# Patient Record
Sex: Male | Born: 1965 | Race: Black or African American | Hispanic: No | Marital: Single | Smoking: Never smoker
Health system: Southern US, Community
[De-identification: ages and names within clinical notes are randomized; demographics above are authoritative.]

## PROBLEM LIST (undated history)

## (undated) DIAGNOSIS — T7840XA Allergy, unspecified, initial encounter: Secondary | ICD-10-CM

## (undated) HISTORY — DX: Allergy, unspecified, initial encounter: T78.40XA

---

## 2010-05-10 ENCOUNTER — Emergency Department (HOSPITAL_COMMUNITY)
Admission: EM | Admit: 2010-05-10 | Discharge: 2010-05-10 | Payer: Self-pay | Source: Home / Self Care | Admitting: Emergency Medicine

## 2012-03-16 ENCOUNTER — Encounter (HOSPITAL_COMMUNITY): Payer: Self-pay | Admitting: Emergency Medicine

## 2012-03-16 ENCOUNTER — Emergency Department (HOSPITAL_COMMUNITY)
Admission: EM | Admit: 2012-03-16 | Discharge: 2012-03-16 | Disposition: A | Discharge status: Home / Self Care | Payer: Self-pay | Attending: Emergency Medicine | Admitting: Emergency Medicine

## 2012-03-16 DIAGNOSIS — M5430 Sciatica, unspecified side: Secondary | ICD-10-CM | POA: Insufficient documentation

## 2012-03-16 DIAGNOSIS — R52 Pain, unspecified: Secondary | ICD-10-CM | POA: Insufficient documentation

## 2012-03-16 DIAGNOSIS — G8929 Other chronic pain: Secondary | ICD-10-CM | POA: Insufficient documentation

## 2012-03-16 DIAGNOSIS — M549 Dorsalgia, unspecified: Secondary | ICD-10-CM

## 2012-03-16 MED ORDER — KETOROLAC TROMETHAMINE 60 MG/2ML IM SOLN
60.0000 mg | Freq: Once | INTRAMUSCULAR | Status: AC
  Administered 2012-03-16: 60 mg via INTRAMUSCULAR
  Filled 2012-03-16: qty 2

## 2012-03-16 MED ORDER — DIAZEPAM 5 MG PO TABS: 5.0000 mg | ORAL_TABLET | Freq: Two times a day (BID) | ORAL | Status: DC

## 2012-03-16 MED ORDER — OXYCODONE-ACETAMINOPHEN 5-325 MG PO TABS: 1.0000 | ORAL_TABLET | ORAL | Status: DC | PRN

## 2012-03-16 NOTE — ED Notes (Signed)
PT. REPORTS LOW BACK PAIN RADIAITING TO LEFT LEG , STATES MOVING HEAVY FURNITURE LAST Sunday , ALSO REPORTS HISTORY OF SCIATICA.

## 2012-03-16 NOTE — ED Provider Notes (Signed)
History     CSN: [id number]  Arrival date & time 03/16/12  0700   First MD Initiated Contact with Patient 03/16/12 [phone]      Chief Complaint  Patient presents with  . Back Pain    (Consider location/radiation/quality/duration/timing/severity/associated sxs/prior treatment) Patient is a 46 y.o. male presenting with back pain. The history is provided by the patient. No language interpreter was used.  Back Pain  This is a recurrent problem. The current episode started more than 2 days ago. The problem occurs daily. The problem has been gradually worsening. The pain is associated with lifting heavy objects. The quality of the pain is described as shooting and burning. The pain radiates to the left thigh. The pain is at a severity of 8/10. The pain is moderate. The symptoms are aggravated by certain positions. The pain is the same all the time. Pertinent negatives include no fever, no numbness, no bowel incontinence, no perianal numbness, no bladder incontinence, no dysuria, no leg pain, no paresthesias, no paresis, no tingling and no weakness. He has tried NSAIDs for the symptoms. The treatment provided mild relief.   46 year old male with recurrent lower back pain that radiates into the left thigh. Denies bladder or bowel incontinence or fever. Patient is taken ibuprofen with mild relief. Patient states that the pain began last Friday and got worse Sunday when he moved furniture. No point tenderness over the lumbar spine. Past medical history of chronic lower back pain since 2008.  History reviewed. No pertinent past medical history.  History reviewed. No pertinent past surgical history.  No family history on file.  History  Substance Use Topics  . Smoking status: Never Smoker   . Smokeless tobacco: Not on file  . Alcohol Use: Yes      Review of Systems  Constitutional: Negative.  Negative for fever.  HENT: Negative.   Eyes: Negative.   Respiratory: Negative.   Cardiovascular:  Negative.  Negative for leg swelling.  Gastrointestinal: Negative.  Negative for nausea, vomiting and bowel incontinence.  Genitourinary: Negative for bladder incontinence and dysuria.  Musculoskeletal: Positive for back pain and gait problem.  Neurological: Negative.  Negative for tingling, weakness, numbness and paresthesias.  Psychiatric/Behavioral: Negative.   All other systems reviewed and are negative.    Allergies  Review of patient's allergies indicates no known allergies.  Home Medications   Current Outpatient Rx  Name Route Sig Dispense Refill  . ADULT MULTIVITAMIN W/MINERALS CH Oral Take 1 tablet by mouth daily.      BP 155/97  Pulse 80  Temp 97.9 F (36.6 C) (Oral)  Resp 20  SpO2 97%  Physical Exam  Nursing note and vitals reviewed. Constitutional: He is oriented to person, place, and time. He appears well-developed and well-nourished.  HENT:  Head: Normocephalic.  Eyes: Conjunctivae normal and EOM are normal. Pupils are equal, round, and reactive to light.  Neck: Normal range of motion. Neck supple.  Cardiovascular: Normal rate.   Pulmonary/Chest: Effort normal.  Abdominal: Soft.  Musculoskeletal: Normal range of motion. He exhibits tenderness.       Left lower back tenderness into the left buttocks and posterior thigh  Neurological: He is alert and oriented to person, place, and time. He has normal reflexes. No cranial nerve deficit. Coordination normal.  Skin: Skin is warm and dry.  Psychiatric: He has a normal mood and affect.    ED Course  Procedures (including critical care time)  Labs Reviewed - No data to display No results  found.   No diagnosis found.    MDM  46 year old male with acute on chronic lumbar back pain with sciatica. Toradol 60 IM in the ER. Prescription for Percocet and IM. Patient was encouraged to use ice as well. Patient will followup with PCP of choice. No red flags no cauda equina signs or symptoms. Patient understands to  return for worsening symptoms.        Remi Haggard, NP 03/16/12 [phone]

## 2012-03-16 NOTE — ED Provider Notes (Signed)
Medical screening examination/treatment/procedure(s) were performed by non-physician practitioner and as supervising physician I was immediately available for consultation/collaboration.  Derwood Kaplan, MD 03/16/12 1920

## 2012-10-29 ENCOUNTER — Encounter (HOSPITAL_COMMUNITY): Payer: Self-pay | Admitting: Emergency Medicine

## 2012-10-29 ENCOUNTER — Emergency Department (HOSPITAL_COMMUNITY)
Admission: EM | Admit: 2012-10-29 | Discharge: 2012-10-29 | Disposition: A | Discharge status: Home / Self Care | Payer: Self-pay | Attending: Emergency Medicine | Admitting: Emergency Medicine

## 2012-10-29 DIAGNOSIS — H9209 Otalgia, unspecified ear: Secondary | ICD-10-CM | POA: Insufficient documentation

## 2012-10-29 DIAGNOSIS — M5432 Sciatica, left side: Secondary | ICD-10-CM

## 2012-10-29 DIAGNOSIS — M5430 Sciatica, unspecified side: Secondary | ICD-10-CM | POA: Insufficient documentation

## 2012-10-29 MED ORDER — OXYCODONE-ACETAMINOPHEN 5-325 MG PO TABS
1.0000 | ORAL_TABLET | Freq: Once | ORAL | Status: AC
  Administered 2012-10-29: 1 via ORAL
  Filled 2012-10-29: qty 1

## 2012-10-29 MED ORDER — HYDROCODONE-ACETAMINOPHEN 5-325 MG PO TABS: 1.0000 | ORAL_TABLET | ORAL | Status: DC | PRN

## 2012-10-29 MED ORDER — IBUPROFEN 600 MG PO TABS: 600.0000 mg | ORAL_TABLET | Freq: Three times a day (TID) | ORAL | Status: DC | PRN

## 2012-10-29 MED ORDER — IBUPROFEN 200 MG PO TABS
600.0000 mg | ORAL_TABLET | Freq: Once | ORAL | Status: AC
  Administered 2012-10-29: 600 mg via ORAL
  Filled 2012-10-29: qty 3

## 2012-10-29 MED ORDER — PREDNISONE 20 MG PO TABS
60.0000 mg | ORAL_TABLET | Freq: Once | ORAL | Status: AC
  Administered 2012-10-29: 60 mg via ORAL
  Filled 2012-10-29: qty 3

## 2012-10-29 MED ORDER — PREDNISONE 20 MG PO TABS: 40.0000 mg | ORAL_TABLET | Freq: Once | ORAL | Status: DC

## 2012-10-29 NOTE — ED Provider Notes (Signed)
History     CSN: [id number]  Arrival date & time 10/29/12  0404   First MD Initiated Contact with Patient 10/29/12 [phone]      Chief Complaint  Patient presents with  . Back Pain  . Otalgia     The history is provided by the patient.   The patient reports left lower back pain with radiation down his left thigh and down his left leg for the past 7-8 months.  He's been seeing 8 chiropractor and had some relief in his symptoms but reports worsening symptoms over the past several days.  He reports radiation of his pain goes down to his leg.  He denies weakness in his left lower extremity.  History of IV drug abuse.  He denies fevers and chills.  No weight loss.  No history of cancer.  No recent fall or trauma.  No bowel or bladder symptoms.  His symptoms are mild to moderate in severity.  He does get some improvement with ibuprofen.  He's never had an MRI of his low back.  He does not have a primary care physician.  His pain is worsened with ambulation and movement in certain decisions.  Nothing improves his pain except for the ibuprofen which decreases his pain somewhat but never completely relieves it.    History reviewed. No pertinent past medical history.  History reviewed. No pertinent past surgical history.  No family history on file.  History  Substance Use Topics  . Smoking status: Never Smoker   . Smokeless tobacco: Not on file  . Alcohol Use: Yes      Review of Systems  All other systems reviewed and are negative.    Allergies  Review of patient's allergies indicates no known allergies.  Home Medications   Current Outpatient Rx  Name  Route  Sig  Dispense  Refill  . diazepam (VALIUM) 5 MG tablet   Oral   Take 1 tablet (5 mg total) by mouth 2 (two) times daily.   10 tablet   0   . ibuprofen (ADVIL,MOTRIN) 600 MG tablet   Oral   Take 1 tablet (600 mg total) by mouth every 8 (eight) hours as needed for pain.   15 tablet   0   . Multiple Vitamin  (MULTIVITAMIN WITH MINERALS) TABS   Oral   Take 1 tablet by mouth daily.         Marland Kitchen oxyCODONE-acetaminophen (ROXICET) 5-325 MG per tablet   Oral   Take 1 tablet by mouth every 4 (four) hours as needed for pain.   15 tablet   0   . predniSONE (DELTASONE) 20 MG tablet   Oral   Take 2 tablets (40 mg total) by mouth once.   10 tablet   0     BP 184/120  Pulse 74  Temp(Src) 98.2 F (36.8 C) (Oral)  Resp 18  SpO2 100%  Physical Exam  Nursing note and vitals reviewed. Constitutional: He is oriented to person, place, and time. He appears well-developed and well-nourished.  HENT:  Head: Normocephalic and atraumatic.  Eyes: EOM are normal.  Neck: Normal range of motion.  Cardiovascular: Normal rate, regular rhythm, normal heart sounds and intact distal pulses.   Pulmonary/Chest: Effort normal and breath sounds normal. No respiratory distress.  Abdominal: Soft. He exhibits no distension. There is no tenderness.  Genitourinary: Rectum normal.  Musculoskeletal: Normal range of motion.  5 out of 5 strength in bilateral lower extremity major muscle groups.  No significant  lumbar or paralumbar tenderness.  No L-spine step-offs.  No lumbar point tenderness.  No low back rash noted.  Neurological: He is alert and oriented to person, place, and time.  Skin: Skin is warm and dry.  Psychiatric: He has a normal mood and affect. Judgment normal.    ED Course  Procedures (including critical care time)  Labs Reviewed - No data to display No results found.   1. Sciatica, left       MDM  I suspect this is left-sided sciatic without weakness of his left lower extremity.  No indication for imaging.  Doubt cauda equina.  Doubt AAA.  Home with instructions to followup with her primary care physician as well as short course of prednisone, ibuprofen, pain meds.        Lyanne Co, MD 10/29/12 [phone]

## 2012-10-29 NOTE — ED Notes (Signed)
PT. REPORTS CHRONIC LOW BACK PAIN FOR SEVERAL MONTHS AND LEFT EAR DISCOMFORT / HEARING LOSS FOR SEVERAL WEEKS , DENIES INJURY OR FALL .

## 2013-11-30 ENCOUNTER — Encounter (HOSPITAL_COMMUNITY): Payer: Self-pay | Admitting: Emergency Medicine

## 2013-11-30 ENCOUNTER — Emergency Department (HOSPITAL_COMMUNITY)
Admission: EM | Admit: 2013-11-30 | Discharge: 2013-11-30 | Disposition: A | Discharge status: Home / Self Care | Payer: No Typology Code available for payment source | Attending: Emergency Medicine | Admitting: Emergency Medicine

## 2013-11-30 DIAGNOSIS — T171XXA Foreign body in nostril, initial encounter: Secondary | ICD-10-CM | POA: Insufficient documentation

## 2013-11-30 DIAGNOSIS — IMO0002 Reserved for concepts with insufficient information to code with codable children: Secondary | ICD-10-CM | POA: Insufficient documentation

## 2013-11-30 DIAGNOSIS — H748X9 Other specified disorders of middle ear and mastoid, unspecified ear: Secondary | ICD-10-CM | POA: Insufficient documentation

## 2013-11-30 DIAGNOSIS — Y9389 Activity, other specified: Secondary | ICD-10-CM | POA: Insufficient documentation

## 2013-11-30 DIAGNOSIS — R0981 Nasal congestion: Secondary | ICD-10-CM

## 2013-11-30 DIAGNOSIS — J3489 Other specified disorders of nose and nasal sinuses: Secondary | ICD-10-CM | POA: Insufficient documentation

## 2013-11-30 DIAGNOSIS — Z79899 Other long term (current) drug therapy: Secondary | ICD-10-CM | POA: Insufficient documentation

## 2013-11-30 DIAGNOSIS — Y929 Unspecified place or not applicable: Secondary | ICD-10-CM | POA: Insufficient documentation

## 2013-11-30 NOTE — Discharge Instructions (Signed)
Nasal Foreign Body  A nasal foreign body is any object inserted inside the nose. Small children often insert small objects in the nose such as beads, coins, and small toys. Older children and adults may also accidentally get an object stuck inside the nose. Having a foreign body in the nose can cause serious medical problems. It may cause trouble breathing. If the object is swallowed and obstructs the esophagus, it can cause difficulty swallowing. A nasal foreign body often causes bleeding of the nose. Depending on the type of object, irritation in the nose may also occur. This can be more serious with certain objects, such as button batteries, magnets, and wooden objects. A foreign body may also cause thick, yellowish, or bad smelling drainage from the nose, as well as pain in the nose and face. These problems can be signs of infection. Nasal foreign bodies require immediate evaluation by a medical professional.   HOME CARE INSTRUCTIONS   · Do not try to remove the object without getting medical advice. Trying to grab the object may push it deeper and make it more difficult to remove.  · Breathe through the mouth until you can see your caregiver. This helps prevent inhalation of the object.  · Keep small objects out of reach of young children.  · Tell your child not to put objects into his or her nose. Tell your child to get help from an adult right away if it happens again.  SEEK MEDICAL CARE IF:   · There is any trouble breathing.  · There is sudden difficulty swallowing, increased drooling, or new chest pain.  · There is any bleeding from the nose.  · The nose continues to drain. An object may still be in the nose.  · A fever, earache, headache, pain in the cheeks or around the eyes, or yellow-green nasal discharge develops. These are signs of a possible sinus infection or ear infection from obstruction of the normal nasal airway.  MAKE SURE YOU:  · Understand these instructions.  · Will watch your  condition.  · Will get help right away if you are not doing well or get worse.  Document Released: 05/22/2000 Document Revised: 08/17/2011 Document Reviewed: 11/13/2010  [employer]® Patient Information ©2015 [employer]. This information is not intended to replace advice given to you by your health care provider. Make sure you discuss any questions you have with your health care provider.

## 2013-11-30 NOTE — ED Notes (Signed)
Patient stated he felt like a but flew up his nose earlier but thinks that is out and now but congestion is the problem now

## 2013-11-30 NOTE — ED Notes (Signed)
Dr. Wofford at bedside 

## 2013-11-30 NOTE — ED Provider Notes (Signed)
CSN: [id number]     Arrival date & [id number] 11/30/13  0325 History   First MD Initiated Contact with Patient 11/30/13 [phone]     Chief Complaint  Patient presents with  . Nasal Congestion     (Consider location/radiation/quality/duration/timing/severity/associated sxs/prior Treatment) HPI Comments: 48 yo male presenting with the sensation that there is a bug in his nose.  He was opening a screen door when he suddenly felt a bug fly into his nose.  He tried to dislodge the bug by blowing his nose, but he does not think it helped.  He has had a mild to moderate amount of congestion in his right nare since then.    Patient is a 48 y.o. male presenting with foreign body in nose.  Foreign Body in Nose This is a new problem. Episode onset: a few hours ago. The problem occurs constantly. The problem has not changed since onset.Pertinent negatives include no chest pain, no abdominal pain, no headaches and no shortness of breath. Associated symptoms comments: Nasal congestion.  . Nothing aggravates the symptoms. Nothing relieves the symptoms. Treatments tried: blowing nose.    History reviewed. No pertinent past medical history. History reviewed. No pertinent past surgical history. History reviewed. No pertinent family history. History  Substance Use Topics  . Smoking status: Never Smoker   . Smokeless tobacco: Not on file  . Alcohol Use: Yes    Review of Systems  HENT: Positive for congestion.   Respiratory: Negative for shortness of breath.   Cardiovascular: Negative for chest pain.  Gastrointestinal: Negative for abdominal pain.  Neurological: Negative for headaches.  All other systems reviewed and are negative.     Allergies  Review of patient's allergies indicates no known allergies.  Home Medications   Prior to Admission medications   Medication Sig Start Date End Date Taking? Authorizing Provider  cetirizine (ZYRTEC) 10 MG tablet Take 10 mg by mouth daily.   Yes Historical  Provider, MD  Multiple Vitamin (MULTIVITAMIN WITH MINERALS) TABS Take 1 tablet by mouth daily.   Yes Historical Provider, MD   BP 147/104  Pulse 94  Temp(Src) 97.7 F (36.5 C) (Oral)  Resp 22  Ht 5\' 11"  (1.803 m)  Wt 175 lb (79.379 kg)  BMI 24.42 kg/m2  SpO2 96% Physical Exam  Nursing note and vitals reviewed. Constitutional: He is oriented to person, place, and time. He appears well-developed and well-nourished. No distress.  HENT:  Head: Normocephalic and atraumatic.  Right Ear: Ear canal normal. No middle ear effusion.  Left Ear: Ear canal normal. A middle ear effusion (small) is present.  Nose: Mucosal edema (R>L nare) present. No sinus tenderness.  No foreign bodies. Right sinus exhibits no maxillary sinus tenderness and no frontal sinus tenderness. Left sinus exhibits no maxillary sinus tenderness and no frontal sinus tenderness.  Eyes: Conjunctivae are normal. No scleral icterus.  Neck: Neck supple.  Cardiovascular: Normal rate and intact distal pulses.   Pulmonary/Chest: Effort normal. No stridor. No respiratory distress.  Abdominal: Normal appearance. He exhibits no distension.  Neurological: He is alert and oriented to person, place, and time.  Skin: Skin is warm and dry. No rash noted.  Psychiatric: He has a normal mood and affect. His behavior is normal.    ED Course  Procedures (including critical care time) Labs Review Labs Reviewed - No data to display  Imaging Review No results found.   EKG Interpretation None      MDM   Final diagnoses:  Nasal congestion  Foreign body in nose, initial encounter    48 yo male with the sensation of a foreign body (bug) in his nose.  On careful exam, no insect or other foreign body was appreciated.  His nose was irrigated with saline without producing a bug.  He was advised to return to the ED if he were to develop fevers, purulent drainage, or facial pain.  I suspect that if he does have an insect in his nose, he would  ultimately self remove it.      Candyce ChurnJohn David Wofford III, MD 11/30/13 [phone]

## 2015-06-23 ENCOUNTER — Ambulatory Visit (INDEPENDENT_AMBULATORY_CARE_PROVIDER_SITE_OTHER): Payer: BLUE CROSS/BLUE SHIELD | Admitting: Family Medicine

## 2015-06-23 ENCOUNTER — Telehealth: Payer: Self-pay

## 2015-06-23 VITALS — BP 156/104 | HR 67 | Temp 98.7°F | Resp 20 | Ht 71.65 in | Wt 185.6 lb

## 2015-06-23 DIAGNOSIS — L989 Disorder of the skin and subcutaneous tissue, unspecified: Secondary | ICD-10-CM

## 2015-06-23 DIAGNOSIS — J019 Acute sinusitis, unspecified: Secondary | ICD-10-CM

## 2015-06-23 DIAGNOSIS — R030 Elevated blood-pressure reading, without diagnosis of hypertension: Secondary | ICD-10-CM | POA: Diagnosis not present

## 2015-06-23 DIAGNOSIS — IMO0001 Reserved for inherently not codable concepts without codable children: Secondary | ICD-10-CM

## 2015-06-23 DIAGNOSIS — L509 Urticaria, unspecified: Secondary | ICD-10-CM | POA: Diagnosis not present

## 2015-06-23 DIAGNOSIS — H6982 Other specified disorders of Eustachian tube, left ear: Secondary | ICD-10-CM

## 2015-06-23 DIAGNOSIS — R238 Other skin changes: Secondary | ICD-10-CM

## 2015-06-23 MED ORDER — CETIRIZINE HCL 10 MG PO TABS: 10.0000 mg | ORAL_TABLET | Freq: Every day | ORAL | Status: DC

## 2015-06-23 MED ORDER — FLUTICASONE PROPIONATE 50 MCG/ACT NA SUSP: 2.0000 | Freq: Every day | NASAL | Status: DC

## 2015-06-23 MED ORDER — HYDROCOD POLST-CPM POLST ER 10-8 MG/5ML PO SUER: 5.0000 mL | Freq: Every evening | ORAL | Status: DC | PRN

## 2015-06-23 MED ORDER — GUAIFENESIN ER 1200 MG PO TB12: 1.0000 | ORAL_TABLET | Freq: Two times a day (BID) | ORAL | Status: DC | PRN

## 2015-06-23 MED ORDER — AMOXICILLIN-POT CLAVULANATE 875-125 MG PO TABS
1.0000 | ORAL_TABLET | Freq: Two times a day (BID) | ORAL | Status: DC
Start: 2015-06-23 — End: 2016-11-04

## 2015-06-23 MED ORDER — IPRATROPIUM BROMIDE 0.03 % NA SOLN: 2.0000 | Freq: Four times a day (QID) | NASAL | Status: DC

## 2015-06-23 MED ORDER — PREDNISONE 20 MG PO TABS: ORAL_TABLET | ORAL | Status: DC

## 2015-06-23 NOTE — Progress Notes (Addendum)
Subjective:  By signing my name below, I, Keith Maxwell, attest that this documentation has been prepared under the direction and in the presence of Norberto Sorenson, MD.  Electronically Signed: Andrew Au, ED Scribe. 06/23/2015. 9:03 AM.   Patient ID: Keith Maxwell, male    DOB: [birth year], 50 y.o.   MRN: [id number]  HPI   Chief Complaint  Patient presents with  . URI    x 1 week   HPI Comments: Keith Maxwell is a 49 y.o. male who presents to the [facility] complaining of sinus congestion that began 1.5 weeks ago. Pt reports symptoms started with a sinus pressure, chills  and nasal congestion 1.5 weeks ago, which he has been treating with OTC sinus pressure relief.  He recently developed chest tenderness only with touch, abdominal discomfort, back pain and left ear pressure. He took Advil last night with temporary relief. He also mentions having welts to his face last week that went away after taking benadryl. He had similar experience about 5 months ago. He denies fever, sore throat, cough, sneezing, CP, and SOB. No hx of asthma. Pt has never been a smoker.    Pt BP is elevated in office. He does not check BP outside of office. He has never been diagnosed with HTN.  Past Medical History  Diagnosis Date  . Allergy    History reviewed. No pertinent past surgical history. Prior to Admission medications   Medication Sig Start Date End Date Taking? Authorizing Provider  Multiple Vitamin (MULTIVITAMIN WITH MINERALS) TABS Take 1 tablet by mouth daily.   Yes Historical Provider, MD  cetirizine (ZYRTEC) 10 MG tablet Take 10 mg by mouth daily. Reported on 06/23/2015    Historical Provider, MD   Review of Systems  Constitutional: Positive for chills. Negative for fever.  HENT: Positive for congestion, ear pain and sinus pressure. Negative for sneezing and sore throat.   Respiratory: Negative for shortness of breath.   Cardiovascular: Negative for chest pain.  Gastrointestinal:  Positive for abdominal pain. Negative for nausea and vomiting.  Musculoskeletal: Positive for myalgias and back pain.  Skin: Positive for rash.   Objective:   Physical Exam  Constitutional: He is oriented to person, place, and time. He appears well-developed and well-nourished. No distress.  HENT:  Head: Normocephalic and atraumatic.  Right Ear: Tympanic membrane is erythematous.  Left Ear: Tympanic membrane is erythematous. A middle ear effusion is present.  Nose: Nose normal.  Mouth/Throat: Posterior oropharyngeal edema and posterior oropharyngeal erythema ( severe) present. No oropharyngeal exudate.  Right tonsil 3+, left tonsil 2+.   Eyes: Conjunctivae and EOM are normal.  Neck: Neck supple. No thyromegaly present.  Cardiovascular: Normal rate, regular rhythm, S1 normal, S2 normal and normal heart sounds.   No murmur heard. Pulmonary/Chest: Effort normal. He has wheezes ( bilateral, upper lobe, clears with deep breath).  Musculoskeletal: Normal range of motion.  Lymphadenopathy:    He has no cervical adenopathy.  Neurological: He is alert and oriented to person, place, and time.  Skin: Skin is warm and dry.  Psychiatric: He has a normal mood and affect. His behavior is normal.  Nursing note and vitals reviewed.   Filed Vitals:   06/23/15 0859 06/23/15 0902  BP: 162/100 156/98  Pulse: 67   Temp: 98.7 F (37.1 C)   TempSrc: Oral   Resp: 20   Height: 5' 11.65" (1.82 m)   Weight: 185 lb 9.6 oz (84.188 kg)   SpO2: 98%  BP recheck-156/104  Assessment & Plan:    Meds ordered this encounter  Medications  . cetirizine (ZYRTEC) 10 MG tablet    Sig: Take 1 tablet (10 mg total) by mouth daily. Reported on 06/23/2015    Dispense:  30 tablet    Refill:  11  . fluticasone (FLONASE) 50 MCG/ACT nasal spray    Sig: Place 2 sprays into both nostrils at bedtime.    Dispense:  16 g    Refill:  2  . ipratropium (ATROVENT) 0.03 % nasal spray    Sig: Place 2 sprays into the nose 4  (four) times daily.    Dispense:  30 mL    Refill:  1  . Guaifenesin (MUCINEX MAXIMUM STRENGTH) 1200 MG TB12    Sig: Take 1 tablet (1,200 mg total) by mouth every 12 (twelve) hours as needed.    Dispense:  14 tablet    Refill:  1  . amoxicillin-clavulanate (AUGMENTIN) 875-125 MG tablet    Sig: Take 1 tablet by mouth 2 (two) times daily.    Dispense:  20 tablet    Refill:  0  . chlorpheniramine-HYDROcodone (TUSSIONEX PENNKINETIC ER) 10-8 MG/5ML SUER    Sig: Take 5 mLs by mouth at bedtime as needed.    Dispense:  90 mL    Refill:  0  . predniSONE (DELTASONE) 20 MG tablet    Sig: 3 tabs x 2d, 2 tabs x 2d, 1 tabs x 4d    Dispense:  14 tablet    Refill:  0   1. Acute sinusitis, recurrence not specified, unspecified location   2. Skin sensitivity - very odd, mild and non-specific - all over his trunk w/ no rash - it seems that his complaints of chest pain/back pain are just that his skin seems sensitive to the touch. No rash. Watchful waiting - hopefully will improve as acute illness resolves  3. Elevated blood pressure - recheck 10d for repeat BP and start medication if not improved - hopefully will improve with resolution of his acute illness and otc cough/cold meds  4. Hives - none currently but he suspects due to some unknown food as has occurred 3-4x over the past several months - will refer to allergist for skin testing  5. Eustachian tube dysfunction, left     Orders Placed This Encounter  Procedures  . Ambulatory referral to Allergy    Referral Priority:  Routine    Referral Type:  Allergy Testing    Referral Reason:  Specialty Services Required    Requested Specialty:  Allergy    Number of Visits Requested:  1     I personally performed the services described in this documentation, which was scribed in my presence. The recorded information has been reviewed and considered, and addended by me as needed.  Norberto SorensonEva Mikaila Grunert, MD MPH

## 2015-06-23 NOTE — Patient Instructions (Signed)
Hot showers or breathing in steam may help loosen the congestion.  Using a netti pot or sinus rinse is also likely to help you feel better and keep this from progressing.  Use the atrovent nasal spray four times throughout the day and use the fluticasone nasal spray every night before bed for at least 2 weeks.  I recommend augmenting with 12 hr sudafed (behind the counter) and generic mucinex to help you move out the congestion.  If no improvement or you are getting worse, come back as you might need a course of steroids but hopefully with all of the above, you can avoid it. You should stay away from all cough and cold medications containing decongestant, especially phenylephrine and pseudoephedrine (will be listed under the active ingredient list).  To make it easier, CoricidinHBP is a product tailored towards people with hypertension. Barotitis Media Barotitis media is inflammation of your middle ear. This occurs when the auditory tube (eustachian tube) leading from the back of your nose (nasopharynx) to your eardrum is blocked. This blockage may result from a cold, environmental allergies, or an upper respiratory infection. Unresolved barotitis media may lead to damage or hearing loss (barotrauma), which may become permanent. HOME CARE INSTRUCTIONS   Use medicines as recommended by your health care provider. Over-the-counter medicines will help unblock the canal and can help during times of air travel.  Do not put anything into your ears to clean or unplug them. Eardrops will not be helpful.  Do not swim, dive, or fly until your health care provider says it is all right to do so. If these activities are necessary, chewing gum with frequent, forceful swallowing may help. It is also helpful to hold your nose and gently blow to pop your ears for equalizing pressure changes. This forces air into the eustachian tube.  Only take over-the-counter or prescription medicines for pain, discomfort, or fever as  directed by your health care provider.  A decongestant may be helpful in decongesting the middle ear and make pressure equalization easier. SEEK MEDICAL CARE IF:  You experience a serious form of dizziness in which you feel as if the room is spinning and you feel nauseated (vertigo).  Your symptoms only involve one ear. SEEK IMMEDIATE MEDICAL CARE IF:   You develop a severe headache, dizziness, or severe ear pain.  You have bloody or pus-like drainage from your ears.  You develop a fever.  Your problems do not improve or become worse. MAKE SURE YOU:   Understand these instructions.  Will watch your condition.  Will get help right away if you are not doing well or get worse.   This information is not intended to replace advice given to you by your health care provider. Make sure you discuss any questions you have with your health care provider.   Document Released: 05/22/2000 Document Revised: 03/15/2013 Document Reviewed: 12/20/2012 Elsevier Interactive Patient Education 2016 Elsevier Inc. Hives Hives are itchy, red, swollen areas of the skin. They can vary in size and location on your body. Hives can come and go for hours or several days (acute hives) or for several weeks (chronic hives). Hives do not spread from person to person (noncontagious). They may get worse with scratching, exercise, and emotional stress. CAUSES   Allergic reaction to food, additives, or drugs.  Infections, including the common cold.  Illness, such as vasculitis, lupus, or thyroid disease.  Exposure to sunlight, heat, or cold.  Exercise.  Stress.  Contact with chemicals. SYMPTOMS  Red or white swollen patches on the skin. The patches may change size, shape, and location quickly and repeatedly.  Itching.  Swelling of the hands, feet, and face. This may occur if hives develop deeper in the skin. DIAGNOSIS  Your caregiver can usually tell what is wrong by performing a physical exam. Skin  or blood tests may also be done to determine the cause of your hives. In some cases, the cause cannot be determined. TREATMENT  Mild cases usually get better with medicines such as antihistamines. Severe cases may require an emergency epinephrine injection. If the cause of your hives is known, treatment includes avoiding that trigger.  HOME CARE INSTRUCTIONS   Avoid causes that trigger your hives.  Take antihistamines as directed by your caregiver to reduce the severity of your hives. Non-sedating or low-sedating antihistamines are usually recommended. Do not drive while taking an antihistamine.  Take any other medicines prescribed for itching as directed by your caregiver.  Wear loose-fitting clothing.  Keep all follow-up appointments as directed by your caregiver. SEEK MEDICAL CARE IF:   You have persistent or severe itching that is not relieved with medicine.  You have painful or swollen joints. SEEK IMMEDIATE MEDICAL CARE IF:   You have a fever.  Your tongue or lips are swollen.  You have trouble breathing or swallowing.  You feel tightness in the throat or chest.  You have abdominal pain. These problems may be the first sign of a life-threatening allergic reaction. Call your local emergency services (911 in [place] MAKE SURE YOU:   Understand these instructions.  Will watch your condition.  Will get help right away if you are not doing well or get worse.   This information is not intended to replace advice given to you by your health care provider. Make sure you discuss any questions you have with your health care provider.   Document Released: 05/25/2005 Document Revised: 05/30/2013 Document Reviewed: 08/18/2011 Elsevier Interactive Patient Education [employer]

## 2015-10-21 ENCOUNTER — Telehealth: Payer: Self-pay | Admitting: *Deleted

## 2015-10-21 ENCOUNTER — Ambulatory Visit (INDEPENDENT_AMBULATORY_CARE_PROVIDER_SITE_OTHER): Payer: BLUE CROSS/BLUE SHIELD | Admitting: Emergency Medicine

## 2015-10-21 ENCOUNTER — Ambulatory Visit (INDEPENDENT_AMBULATORY_CARE_PROVIDER_SITE_OTHER): Payer: BLUE CROSS/BLUE SHIELD

## 2015-10-21 VITALS — BP 144/66 | HR 74 | Temp 98.1°F | Resp 16 | Ht 71.0 in | Wt 183.0 lb

## 2015-10-21 DIAGNOSIS — R938 Abnormal findings on diagnostic imaging of other specified body structures: Secondary | ICD-10-CM | POA: Diagnosis not present

## 2015-10-21 DIAGNOSIS — H6982 Other specified disorders of Eustachian tube, left ear: Secondary | ICD-10-CM | POA: Diagnosis not present

## 2015-10-21 DIAGNOSIS — J301 Allergic rhinitis due to pollen: Secondary | ICD-10-CM | POA: Diagnosis not present

## 2015-10-21 DIAGNOSIS — J029 Acute pharyngitis, unspecified: Secondary | ICD-10-CM | POA: Diagnosis not present

## 2015-10-21 DIAGNOSIS — J309 Allergic rhinitis, unspecified: Secondary | ICD-10-CM | POA: Insufficient documentation

## 2015-10-21 DIAGNOSIS — J209 Acute bronchitis, unspecified: Secondary | ICD-10-CM

## 2015-10-21 DIAGNOSIS — R9389 Abnormal findings on diagnostic imaging of other specified body structures: Secondary | ICD-10-CM

## 2015-10-21 LAB — POCT RAPID STREP A (OFFICE): Rapid Strep A Screen: NEGATIVE

## 2015-10-21 MED ORDER — DOXYCYCLINE HYCLATE 100 MG PO TABS: 100.0000 mg | ORAL_TABLET | Freq: Two times a day (BID) | ORAL | Status: DC

## 2015-10-21 MED ORDER — RANITIDINE HCL 150 MG PO TABS: 150.0000 mg | ORAL_TABLET | Freq: Two times a day (BID) | ORAL | Status: DC

## 2015-10-21 MED ORDER — PREDNISONE 10 MG PO TABS: ORAL_TABLET | ORAL | Status: DC

## 2015-10-21 NOTE — Telephone Encounter (Signed)
Patient was called and I left an detailed message.  The message stated that Dr. Cleta Albertsaub wanted to have him scheduled for an CT Chest on tomorrow; however, Dr. Cleta Albertsaub wanted to know if he was okay with it.  I advised the patient to call back and ask for the TL and let them know if he wanted to proceed with this procedure for tomorrow.

## 2015-10-21 NOTE — Patient Instructions (Signed)
     IF you received an x-ray today, you will receive an invoice from [facility]. Please contact [facility] at [phone] with questions or concerns regarding your invoice.   IF you received labwork today, you will receive an invoice from [facility]. Please contact [facility] at [phone] with questions or concerns regarding your invoice.   Our billing staff will not be able to assist you with questions regarding bills from these companies.  You will be contacted with the lab results as soon as they are available. The fastest way to get your results is to activate your My Chart account. Instructions are located on the last page of this paperwork. If you have not heard from us regarding the results in 2 weeks, please contact this office.      

## 2015-10-21 NOTE — Progress Notes (Addendum)
Patient ID: Keith Maxwell, male   DOB: Mar 09, 1966, 50 y.o.   MRN: [id number]    By signing my name below I, Shelah Lewandowsky, attest that this documentation has been prepared under the direction and in the presence of Lesle Chris, MD. Electonically Signed. Shelah Lewandowsky, Scribe 10/21/2015 at 1:19 PM   Chief Complaint:  Chief Complaint  Patient presents with  . Dysphagia    Started yesterday    HPI: Keith Maxwell is a 50 y.o. male who reports to [facility] today complaining of sore throat and difficulty swallowing since last night. Pt denies fever. Pt has intermittent mild productive cough. Pt has runny nose due to allergies. Pt took a zyrtec this morning with minimal relief. Patient states he is able to keep. He has had no food lodging in his throat and when he does eat food passes without difficulty. It is mainly the soreness that is the issue.  Pt's left ear sounds "muffled".   Pt was given allergy medication that he discontinued the medication due to it making him feel groggy.   Pt saw an allergist several months ago after he had cough, sore throat, and runny nose. Pt was treated with prednisone and antibiotics which resolved pt's symptoms. Pt denies getting chest xray at the time.   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   Past Medical History  Diagnosis Date  . Allergy    No past surgical history on file. Social History   Social History  . Marital Status: Single    Spouse Name: N/A  . Number of Children: N/A  . Years of Education: N/A   Social History Main Topics  . Smoking status: Never Smoker   . Smokeless tobacco: None  . Alcohol Use: Yes  . Drug Use: No  . Sexual Activity: Not Asked   Other Topics Concern  . None   Social History Narrative   Family History  Problem Relation Age of Onset  . Diabetes Father    No Known Allergies Prior to Admission medications   Medication Sig Start Date End Date Taking? Authorizing  Provider  cetirizine (ZYRTEC) 10 MG tablet Take 1 tablet (10 mg total) by mouth daily. Reported on 06/23/2015 06/23/15  Yes Sherren Mocha, MD  fluticasone [facility]) 50 MCG/ACT nasal spray Place 2 sprays into both nostrils at bedtime. 06/23/15  Yes Sherren Mocha, MD  Guaifenesin [facility] MAXIMUM STRENGTH) 1200 MG TB12 Take 1 tablet (1,200 mg total) by mouth every 12 (twelve) hours as needed. 06/23/15  Yes Sherren Mocha, MD  ipratropium (ATROVENT) 0.03 % nasal spray Place 2 sprays into the nose 4 (four) times daily. 06/23/15  Yes Sherren Mocha, MD  Multiple Vitamin (MULTIVITAMIN WITH MINERALS) TABS Take 1 tablet by mouth daily.   Yes Historical Provider, MD  amoxicillin-clavulanate (AUGMENTIN) 875-125 MG tablet Take 1 tablet by mouth 2 (two) times daily. Patient not taking: Reported on 10/21/2015 06/23/15   Sherren Mocha, MD  chlorpheniramine-HYDROcodone [facility]) 10-8 MG/5ML SUER Take 5 mLs by mouth at bedtime as needed. Patient not taking: Reported on 10/21/2015 06/23/15   Sherren Mocha, MD  predniSONE (DELTASONE) 20 MG tablet 3 tabs x 2d, 2 tabs x 2d, 1 tabs x 4d Patient not taking: Reported on 10/21/2015 06/23/15   Sherren Mocha, MD     ROS: The patient denies fevers, chills, night sweats, unintentional weight loss, chest pain, palpitations, wheezing, dyspnea on exertion,  nausea, vomiting, abdominal pain, dysuria, hematuria, melena, numbness, weakness, or tingling. Pt is positive for sore throat.   All other systems have been reviewed and were otherwise negative with the exception of those mentioned in the HPI and as above.    PHYSICAL EXAM: Filed Vitals:   10/21/15 1211  BP: 144/66  Pulse: 74  Temp: 98.1 F (36.7 C)  Resp: 16   Body mass index is 25.53 kg/(m^2).   General: Alert, no acute distress HEENT:  Normocephalic, atraumatic. Pt's left ear has poor movement with pneumatic testing. Pt has mild postpharyngeal erythema. [facility] Cardiovascular:  Regular rate and rhythm, no rubs  murmurs or gallops.  No Carotid bruits, radial pulse intact. No pedal edema.  Respiratory: Clear to auscultation bilaterally.  No wheezes, rales, or rhonchi.  No cyanosis, no use of accessory musculature Abdominal: No organomegaly, abdomen is soft and non-tender, positive bowel sounds.  No masses. Musculoskeletal: Gait intact. No edema, tenderness Skin: No rashes. Neurologic: Facial musculature symmetric. Psychiatric: Patient acts appropriately throughout our interaction. Lymphatic: No cervical or submandibular lymphadenopathy    LABS: Results for orders placed or performed in visit on 10/21/15  POCT rapid strep A  Result Value Ref Range   Rapid Strep A Screen Negative Negative      EKG/XRAY:   [facility]  Dg Neck Soft Tissue  10/21/2015  CLINICAL DATA:  Difficulty swallowing, unable to do AP view do to patient having un removable hair EXAM: NECK SOFT TISSUES - 1+ VIEW COMPARISON:  None. FINDINGS: Aryepiglottic folds unremarkable. No retropharyngeal gas. No radiodense foreign body. Mild reversal of the normal lordosis in the upper cervical spine. No fracture or dislocation. No prevertebral soft tissue swelling. Anterior endplate spur at C5-6. IMPRESSION: 1. Unremarkable neck soft tissues. 2. Loss of the normal cervical spine lordosis, which may be secondary to positioning, spasm, or soft tissue injury. Electronically Signed   By: Corlis Leak  Hassell M.D.   On: 10/21/2015 13:14   Dg Chest 2 View  10/21/2015  CLINICAL DATA:  Shortness of breath. EXAM: CHEST  2 VIEW COMPARISON:  No recent prior. FINDINGS: Mediastinum and hilar structures are normal. Low lung volumes with mild bibasilar atelectasis and/or infiltrates. Heart size normal. Elevation of the left hemidiaphragm again noted. Air-filled loops of small and large bowel noted. Adynamic ileus cannot be excluded . IMPRESSION: 1. Mild bibasilar atelectasis and/or infiltrates. Stable elevation left hemidiaphragm. 2.   Mild adynamic ileus cannot be excluded. Electronically Signed   By: Maisie Fushomas  Register   On: 10/21/2015 13:15     ASSESSMENT/PLAN: Patient has an elevated left hemidiaphragm. Marland Kitchen.His abdominal exam was unremarkable however because of the extensive gas underneath the left diaphragm this was read as a possible adynamic ileus. He is placed on prednisone in a taper. Most of his symptoms sound allergic related however they are reading  his chest x-ray as mild bibasal atelectasis and/or infiltrates. He has had productive cough at times. Will cover with doxycycline for bronchitis. As stated previously we'll have evaluation of his abnormal chest x-ray by the pulmonary specialist. I also will go ahead and order a CT of the chest without contrast. Some of these symptoms could also be reflux related. I also placed him on Zantac 150 mg twice a day. With his abnormal elevation of left diaphragm possibly this is contributing to reflux symptoms and therefore throat symptoms.I personally performed the services described in this documentation, which was scribed in my presence. The recorded  information has been reviewed and is accurate.     Gross sideeffects, risk and benefits, and alternatives of medications d/w patient. Patient is aware that all medications have potential sideeffects and we are unable to predict every sideeffect or drug-drug interaction that may occur.  Lesle Chris MD 10/21/2015 12:39 PM

## 2015-10-23 LAB — CULTURE, GROUP A STREP: Organism ID, Bacteria: NORMAL

## 2015-11-12 ENCOUNTER — Inpatient Hospital Stay: Admission: RE | Admit: 2015-11-12 | Payer: No Typology Code available for payment source | Source: Ambulatory Visit

## 2015-11-22 ENCOUNTER — Inpatient Hospital Stay: Admission: RE | Admit: 2015-11-22 | Payer: No Typology Code available for payment source | Source: Ambulatory Visit

## 2015-11-28 ENCOUNTER — Institutional Professional Consult (permissible substitution): Payer: No Typology Code available for payment source | Admitting: Internal Medicine

## 2016-11-04 ENCOUNTER — Ambulatory Visit (INDEPENDENT_AMBULATORY_CARE_PROVIDER_SITE_OTHER): Payer: BLUE CROSS/BLUE SHIELD | Admitting: Physician Assistant

## 2016-11-04 ENCOUNTER — Encounter: Payer: Self-pay | Admitting: Physician Assistant

## 2016-11-04 VITALS — BP 148/86 | HR 98 | Temp 98.0°F | Resp 18 | Ht 71.65 in | Wt 185.0 lb

## 2016-11-04 DIAGNOSIS — H18891 Other specified disorders of cornea, right eye: Secondary | ICD-10-CM

## 2016-11-04 DIAGNOSIS — R030 Elevated blood-pressure reading, without diagnosis of hypertension: Secondary | ICD-10-CM | POA: Diagnosis not present

## 2016-11-04 MED ORDER — CEPHALEXIN 750 MG PO CAPS: 750.0000 mg | ORAL_CAPSULE | Freq: Two times a day (BID) | ORAL | 0 refills | Status: AC

## 2016-11-04 MED ORDER — PREDNISONE 50 MG PO TABS: ORAL_TABLET | ORAL | 0 refills | Status: AC

## 2016-11-04 NOTE — Patient Instructions (Signed)
     IF you received an x-ray today, you will receive an invoice from [facility]. Please contact [facility] at [phone] with questions or concerns regarding your invoice.   IF you received labwork today, you will receive an invoice from [facility]. Please contact [facility] at [phone] with questions or concerns regarding your invoice.   Our billing staff will not be able to assist you with questions regarding bills from these companies.  You will be contacted with the lab results as soon as they are available. The fastest way to get your results is to activate your My Chart account. Instructions are located on the last page of this paperwork. If you have not heard from us regarding the results in 2 weeks, please contact this office.     

## 2016-11-04 NOTE — Progress Notes (Signed)
  11/07/2016 3:01 PM   DOB: 07/02/1965 / MRN: [id number]  SUBJECTIVE:  Keith Maxwell is a 51 y.o. male presenting for eye swelling that started after a bug flew in his eyes.  Tells me "I hit the bug and then rubbed my eyes really hard."  He associates eye itching. Denies vision changes and frank eye pain.  He has not tried anything for this yet.   He has No Known Allergies.   He  has a past medical history of Allergy.    He  reports that he has never smoked. He has never used smokeless tobacco. He reports that he drinks alcohol. He reports that he does not use drugs. He  has no sexual activity history on file. The patient  has no past surgical history on file.  His family history includes Diabetes in his father.  Review of Systems  Constitutional: Negative for fever.  Eyes: Negative for photophobia and discharge.  Skin: Negative for rash.  Neurological: Negative for dizziness and headaches.    The problem list and medications were reviewed and updated by myself where necessary and exist elsewhere in the encounter.   OBJECTIVE:  BP (!) 148/86   Pulse 98   Temp 98 F (36.7 C) (Oral)   Resp 18   Ht 5' 11.65" (1.82 m)   Wt 185 lb (83.9 kg)   SpO2 97%   BMI 25.33 kg/m     Physical Exam  Constitutional: He appears well-developed. He is active and cooperative.  Non-toxic appearance.  Cardiovascular: Normal rate.   Pulmonary/Chest: Effort normal. No tachypnea.  Neurological: He is alert.  Skin: Skin is warm and dry. He is not diaphoretic. No pallor.  Vitals reviewed.       No results found for this or any previous visit (from the past 72 hour(s)).  No results found.  ASSESSMENT AND PLAN:  Keith Maxwell was seen today for facial swelling.  Diagnoses and all orders for this visit:  Corneal irritation of right eye: ED precautions discussed.  Patient voiced understanding  -     predniSONE (DELTASONE) 50 MG tablet; Take one tab daily. -     cephALEXin (KEFLEX) 750 MG capsule; Take  1 capsule (750 mg total) by mouth 2 (two) times daily.  Elevated blood pressure reading Comments: Advised he monitor this.  readings persistenlty greater than 140/90 warrnat medication. Patient voiceds understanding.     The patient is advised to call or return to clinic if he does not see an improvement in symptoms, or to seek the care of the closest emergency department if he worsens with the above plan.   Deliah BostonMichael Clark, MHS, PA-C [facility] 11/07/2016 3:01 PM

## 2018-01-02 IMAGING — CR DG CHEST 2V
3 series · 3 of 3 positions shown · non-contrast
Comparison: No recent prior.

CLINICAL DATA: Shortness of breath.

EXAM:
CHEST  2 VIEW

[PA (1 of 2)]
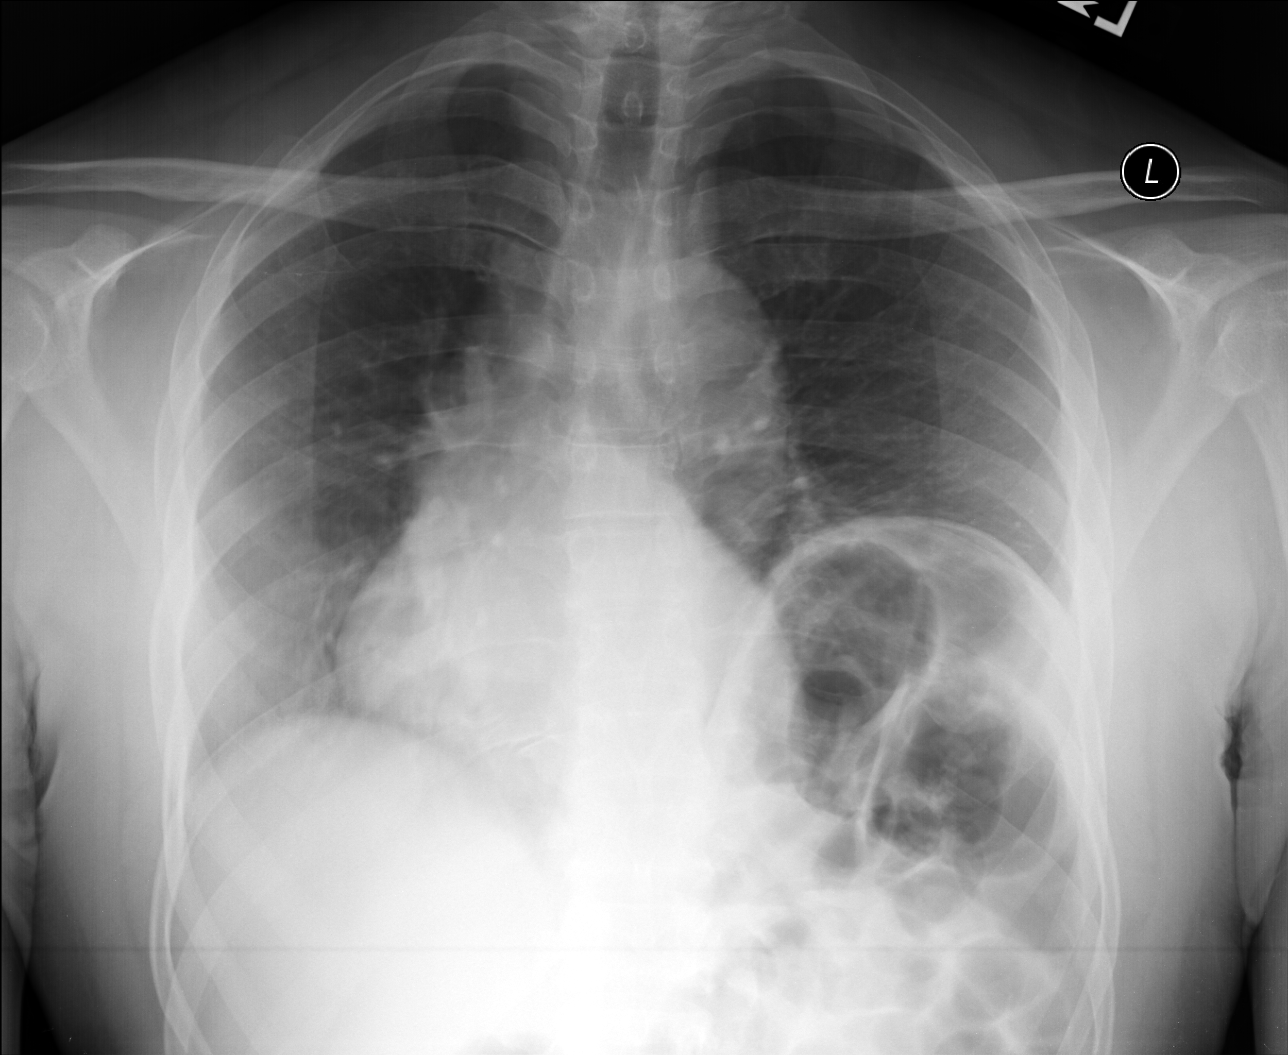

[lateral]
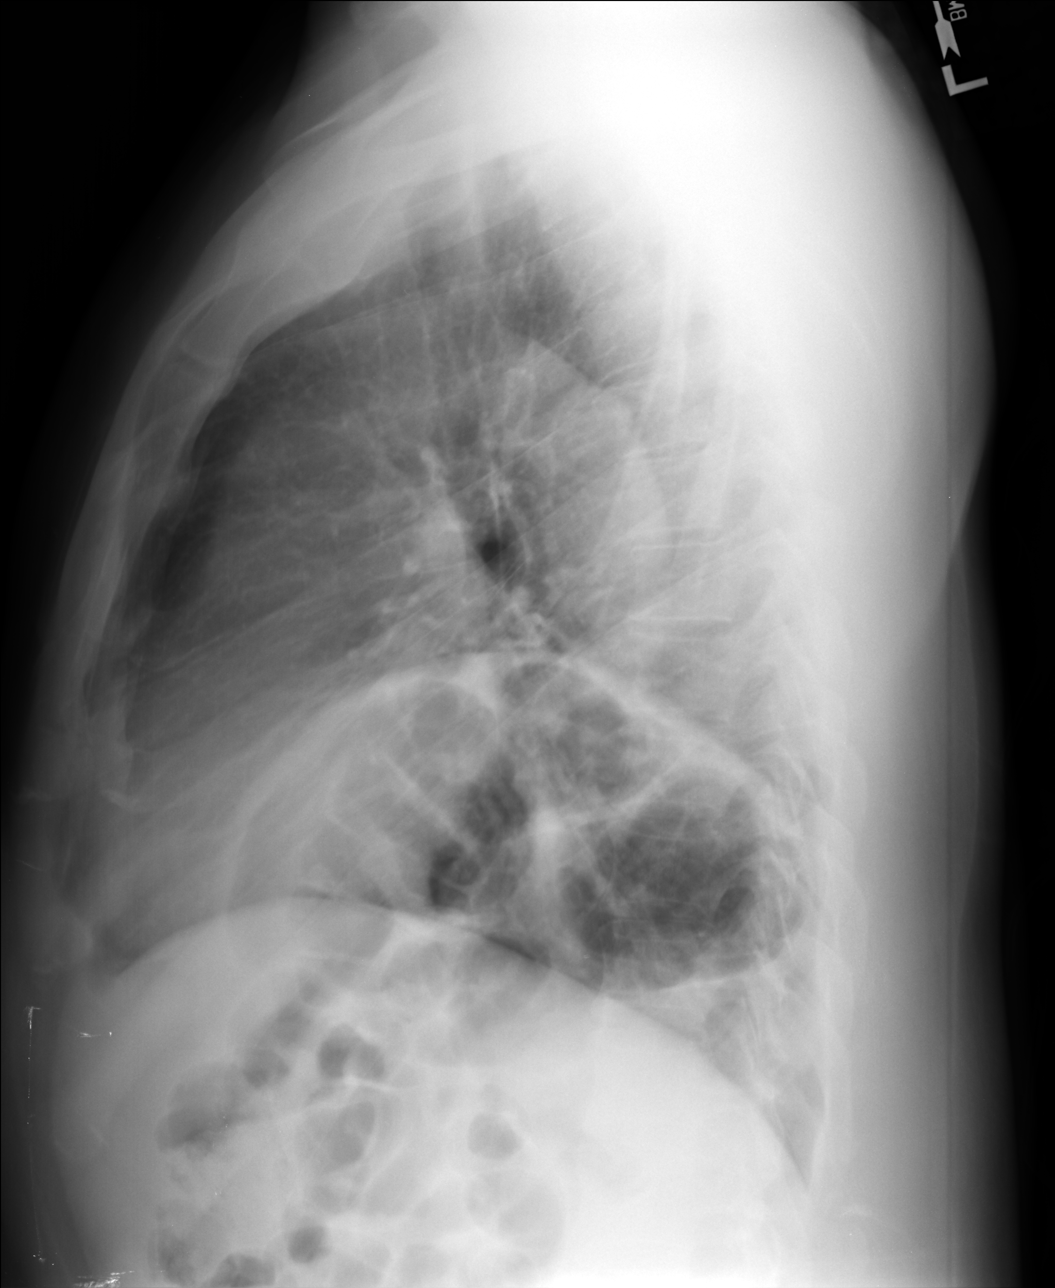

[PA (2 of 2)]
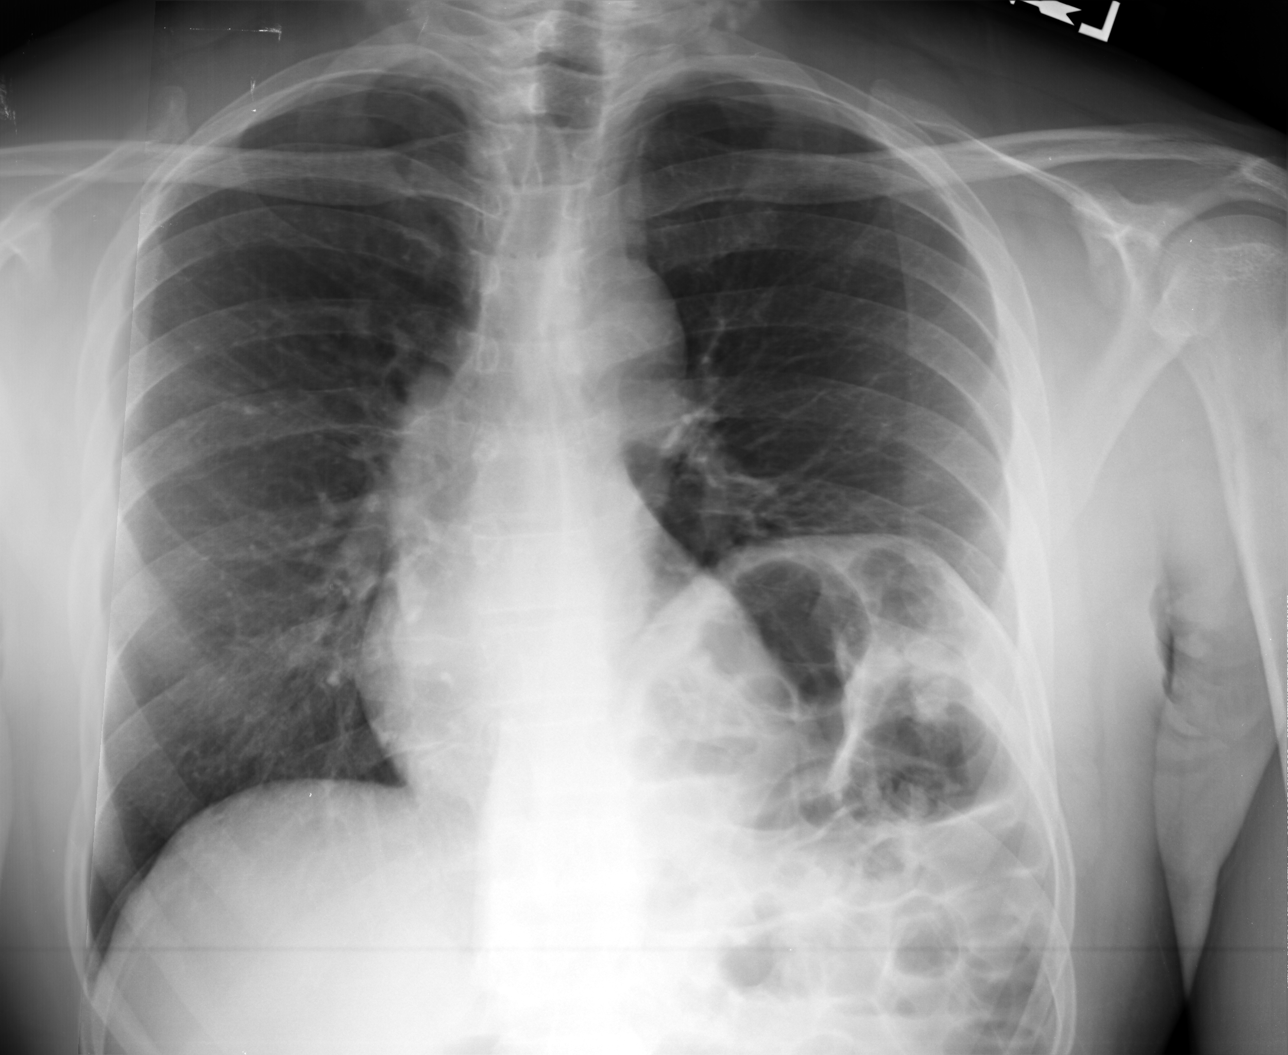

[3 of 3 positions shown; findings below may reference images not displayed]

FINDINGS: Mediastinum and hilar structures are normal. Low lung volumes with
mild bibasilar atelectasis and/or infiltrates. Heart size normal.
Elevation of the left hemidiaphragm again noted. Air-filled loops of
small and large bowel noted. Adynamic ileus cannot be excluded .
IMPRESSION: 1. Mild bibasilar atelectasis and/or infiltrates. Stable elevation
left hemidiaphragm.

2.  Mild adynamic ileus cannot be excluded.

## 2019-01-02 ENCOUNTER — Emergency Department (HOSPITAL_COMMUNITY)
Admission: EM | Admit: 2019-01-02 | Discharge: 2019-01-02 | Disposition: A | Discharge status: Home / Self Care | Payer: No Typology Code available for payment source | Attending: Emergency Medicine | Admitting: Emergency Medicine

## 2019-01-02 ENCOUNTER — Other Ambulatory Visit: Payer: Self-pay

## 2019-01-02 ENCOUNTER — Encounter (HOSPITAL_COMMUNITY): Payer: Self-pay | Admitting: Emergency Medicine

## 2019-01-02 DIAGNOSIS — G4700 Insomnia, unspecified: Secondary | ICD-10-CM | POA: Insufficient documentation

## 2019-01-02 DIAGNOSIS — Z79899 Other long term (current) drug therapy: Secondary | ICD-10-CM | POA: Insufficient documentation

## 2019-01-02 MED ORDER — DIPHENHYDRAMINE HCL 25 MG PO TABS: 25.0000 mg | ORAL_TABLET | Freq: Four times a day (QID) | ORAL | 0 refills | Status: AC

## 2019-01-02 NOTE — ED Provider Notes (Signed)
[facility] Provider Note   CSN: [id number] Arrival date & time: 01/02/19  1344    History   Chief Complaint Chief Complaint  Patient presents with  . Insomnia    HPI Marc MorgansSean Maxwell is a 53 y.o. male.     HPI   [age] year old male presents today with complaints of insomnia.  Patient notes that for the last month he has been having difficulty falling asleep.  He notes he started a new job requiring him to get up early.  He notes that he cannot fall asleep at night.  He notes that he used to stay up late and sleep during the day.  He notes that he stopped drinking approximately month ago as well.  He has not tried any medications to help him sleep.  Past Medical History:  Diagnosis Date  . Allergy     Patient Active Problem List   Diagnosis Date Noted  . Allergic rhinitis 10/21/2015    History reviewed. No pertinent surgical history.      Home Medications    Prior to Admission medications   Medication Sig Start Date End Date Taking? Authorizing Provider  cephALEXin (KEFLEX) 750 MG capsule Take 1 capsule (750 mg total) by mouth 2 (two) times daily. 11/04/16   Ofilia Neaslark, Michael L, PA-C  diphenhydrAMINE (BENADRYL) 25 MG tablet Take 1 tablet (25 mg total) by mouth every 6 (six) hours. 01/02/19   Kiffany Schelling, Tinnie GensJeffrey, PA-C  predniSONE (DELTASONE) 50 MG tablet Take one tab daily. 11/04/16   Ofilia Neaslark, Michael L, PA-C    Family History Family History  Problem Relation Age of Onset  . Diabetes Father     Social History Social History   Tobacco Use  . Smoking status: Never Smoker  . Smokeless tobacco: Never Used  Substance Use Topics  . Alcohol use: Yes  . Drug use: No     Allergies   Patient has no known allergies.   Review of Systems Review of Systems  All other systems reviewed and are negative.    Physical Exam Updated Vital Signs BP (!) 162/119 (BP Location: Right Arm)   Pulse 81   Temp 98.9 F (37.2 C) (Oral)   Resp 18    SpO2 99%   Physical Exam Vitals signs and nursing note reviewed.  Constitutional:      Appearance: He is well-developed.     Comments: Sleeping upon arrival to the room  HENT:     Head: Normocephalic and atraumatic.  Eyes:     General: No scleral icterus.       Right eye: No discharge.        Left eye: No discharge.     Conjunctiva/sclera: Conjunctivae normal.     Pupils: Pupils are equal, round, and reactive to light.  Neck:     Musculoskeletal: Normal range of motion.     Vascular: No JVD.     Trachea: No tracheal deviation.  Pulmonary:     Effort: Pulmonary effort is normal.     Breath sounds: No stridor.  Neurological:     Mental Status: He is alert and oriented to person, place, and time.     Coordination: Coordination normal.  Psychiatric:        Behavior: Behavior normal.        Thought Content: Thought content normal.        Judgment: Judgment normal.      ED Treatments / Results  Labs (all labs ordered are listed, but  only abnormal results are displayed) Labs Reviewed - No data to display  EKG None  Radiology No results found.  Procedures Procedures (including critical care time)  Medications Ordered in ED Medications - No data to display   Initial Impression / Assessment and Plan / ED Course  I have reviewed the triage vital signs and the nursing notes.  Pertinent labs & imaging results that were available during my care of the patient were reviewed by me and considered in my medical decision making (see chart for details).        53 year old male presents today with insomnia.  This is likely secondary to new sleep patterns.  Patient encouraged use melatonin Benadryl as needed follow-up with primary care if he continues to have difficulty sleeping.  Final Clinical Impressions(s) / ED Diagnoses   Final diagnoses:  Insomnia, unspecified type    ED Discharge Orders         Ordered    diphenhydrAMINE (BENADRYL) 25 MG tablet  Every 6 hours      01/02/19 1530           Okey Regal, PA-C 01/02/19 1607    Blanchie Dessert, MD 01/02/19 2223

## 2019-01-02 NOTE — ED Triage Notes (Signed)
Pt reports difficulty sleeping, recently started a new job changing from nights to days. Pt reports recently stopped drinking at the beginning of the month. Pt reports he has had a headache and congestion over 1 week ago. Pt reports he had 2 alcoholic drinks Friday and 2 on Saturday.

## 2019-01-02 NOTE — Discharge Instructions (Addendum)
Please read attached information. If you experience any new or worsening signs or symptoms please return to the emergency room for evaluation. Please follow-up with your primary care provider or specialist as discussed. Please use medication prescribed only as directed and discontinue taking if you have any concerning signs or symptoms.   °
